# Patient Record
Sex: Male | Born: 1960 | Smoking: Current every day smoker
Health system: Southern US, Community
[De-identification: ages and names within clinical notes are randomized; demographics above are authoritative.]

---

## 2007-04-23 ENCOUNTER — Emergency Department: Payer: Self-pay | Admitting: Internal Medicine

## 2011-04-09 ENCOUNTER — Ambulatory Visit: Payer: Self-pay | Admitting: Internal Medicine

## 2017-12-13 ENCOUNTER — Emergency Department
Admission: EM | Admit: 2017-12-13 | Discharge: 2017-12-13 | Disposition: A | Discharge status: Home / Self Care | Payer: 59 | Attending: Emergency Medicine | Admitting: Emergency Medicine

## 2017-12-13 ENCOUNTER — Other Ambulatory Visit: Payer: Self-pay

## 2017-12-13 ENCOUNTER — Emergency Department: Payer: 59

## 2017-12-13 DIAGNOSIS — F17210 Nicotine dependence, cigarettes, uncomplicated: Secondary | ICD-10-CM | POA: Insufficient documentation

## 2017-12-13 DIAGNOSIS — R0789 Other chest pain: Secondary | ICD-10-CM | POA: Insufficient documentation

## 2017-12-13 DIAGNOSIS — R079 Chest pain, unspecified: Secondary | ICD-10-CM

## 2017-12-13 LAB — CBC
HEMATOCRIT: 45.8 % (ref 40.0–52.0)
Hemoglobin: 15.6 g/dL (ref 13.0–18.0)
MCH: 29.8 pg (ref 26.0–34.0)
MCHC: 34 g/dL (ref 32.0–36.0)
MCV: 87.7 fL (ref 80.0–100.0)
Platelets: 172 10*3/uL (ref 150–440)
RBC: 5.23 MIL/uL (ref 4.40–5.90)
RDW: 14.4 % (ref 11.5–14.5)
WBC: 5.9 10*3/uL (ref 3.8–10.6)

## 2017-12-13 LAB — TROPONIN I: Troponin I: <0.03 ng/mL (ref <0.03)

## 2017-12-13 LAB — BASIC METABOLIC PANEL
Anion gap: 8 (ref 5–15)
BUN: 13 mg/dL (ref 6–20)
CHLORIDE: 103 mmol/L (ref 101–111)
CO2: 25 mmol/L (ref 22–32)
Calcium: 8.9 mg/dL (ref 8.9–10.3)
Creatinine, Ser: 0.94 mg/dL (ref 0.61–1.24)
GFR calc non Af Amer: >60 mL/min (ref >60)
Glucose, Bld: 134 mg/dL — ABNORMAL HIGH (ref 65–99)
POTASSIUM: 3.9 mmol/L (ref 3.5–5.1)
SODIUM: 136 mmol/L (ref 135–145)

## 2017-12-13 NOTE — ED Provider Notes (Addendum)
[facility] Emergency Departent Provider Note  ___________________________________________   First MD Initiated Contact with Patient 12/13/17 1931     (approximate)  I have reviewed the triage vital signs and the nursing notes.   HISTORY  Chief Complaint Chest Pain  HPI Daleen SnookRichard A Tomkinson is a 57 y.o. male with a history of cervical radiculopathy as well as smoking who was presented to the emergency department today with left-sided chest as well as left sided arm pain.  He says that over the past 2 days he has had left upper chest pain is been rating down the left arm and up to the left jaw that lasts for only several seconds.  He feels like an electric shock type pain.  Says that he is also been short of breath over the past several days and went up a flight of stairs today and was very short of breath and broke out in a sweat and this would brought him to the emergency department.  He says that he has had right-sided arm pain before that is been attributed to cervical radiculopathy.  However, the left-sided chest as well as arm pain is new.  Denies any nausea.  Denies any symptoms at this time.  Says that his father died from heart attack in his early 3360s.  Patient says that he is a negative stress test in his past but this was years ago.  Denies any weakness.   History reviewed. No pertinent past medical history.  There are no active problems to display for this patient.   History reviewed. No pertinent surgical history.  Prior to Admission medications   Not on File    Allergies Wellbutrin [bupropion]  No family history on file.  Social History Social History   Tobacco Use  . Smoking status: Current Every Day Smoker  Substance Use Topics  . Alcohol use: Never    Frequency: Never  . Drug use: Not on file    Review of Systems  Constitutional: No fever/chills Eyes: No visual changes. ENT: No sore throat. Cardiovascular: As  above Respiratory: As above Gastrointestinal: No abdominal pain.  No nausea, no vomiting.  No diarrhea.  No constipation. Genitourinary: Negative for dysuria. Musculoskeletal: Negative for back pain. Skin: Negative for rash. Neurological: Negative for headaches, focal weakness or numbness.   ____________________________________________   PHYSICAL EXAM:  VITAL SIGNS: ED Triage Vitals  Enc Vitals Group     BP 12/13/17 1742 133/69     Pulse Rate 12/13/17 1742 83     Resp 12/13/17 1742 18     Temp 12/13/17 1742 98.4 F (36.9 C)     Temp Source 12/13/17 1742 Oral     SpO2 12/13/17 1742 96 %     Weight 12/13/17 1740 208 lb (94.3 kg)     Height 12/13/17 1740 6\' 1"  (1.854 m)     Head Circumference --      Peak Flow --      Pain Score 12/13/17 1740 3     Pain Loc --      Pain Edu? --      Excl. in GC? --     Constitutional: Alert and oriented. Well appearing and in no acute distress. Eyes: Conjunctivae are normal.  Head: Atraumatic. Nose: No congestion/rhinnorhea. Mouth/Throat: Mucous membranes are moist.  Neck: No stridor.   Cardiovascular: Normal rate, regular rhythm. Grossly normal heart sounds.  Good peripheral circulation with equal and bilateral radial pulses. Respiratory: Normal respiratory effort.  No  retractions. Lungs CTAB. Gastrointestinal: Soft and nontender. No distention.  Musculoskeletal: No lower extremity tenderness nor edema.  No joint effusions. Neurologic:  Normal speech and language. No gross focal neurologic deficits are appreciated. Skin:  Skin is warm, dry and intact. No rash noted. Psychiatric: Mood and affect are normal. Speech and behavior are normal.  ____________________________________________   LABS (all labs ordered are listed, but only abnormal results are displayed)  Labs Reviewed  BASIC METABOLIC PANEL - Abnormal; Notable for the following components:      Result Value   Glucose, Bld 134 (*)    All other components within normal  limits  CBC  TROPONIN I  TROPONIN I   ____________________________________________  EKG  ED ECG REPORT I, Arelia Longest, the attending physician, personally viewed and interpreted this ECG.   Date: 12/13/2017  EKG Time: 1742  Rate: 85  Rhythm: normal sinus rhythm  Axis: Normal  Intervals:none  ST&T Change: No ST segment elevation or depression.  No abnormal T wave inversion.  ____________________________________________  RADIOLOGY  Nodularity versus scarring to the right upper field ____________________________________________   PROCEDURES  Procedure(s) performed:   Procedures  Critical Care performed:   ____________________________________________   INITIAL IMPRESSION / ASSESSMENT AND PLAN / ED COURSE  Pertinent labs & imaging results that were available during my care of the patient were reviewed by me and considered in my medical decision making (see chart for details).  Differential diagnosis includes, but is not limited to, ACS, aortic dissection, pulmonary embolism, cardiac tamponade, pneumothorax, pneumonia, pericarditis, myocarditis, GI-related causes including esophagitis/gastritis, and musculoskeletal chest wall pain.   As part of my medical decision making, I reviewed the following data within the electronic MEDICAL RECORD NUMBER Notes from prior outpatient records  Patient asymptomatic at this time.  Heart score is a 09/03/2002.  Discussed further options with the patient including second troponin and overnights in hospital but he says that he would like to go home.  He says that he was concerned about a stroke.  However, I told him that these are very unlikely to be stroke symptoms and I am much more concerned for cardiac disease.  Pt refused 2nd troponin and does not want to stay for admission.  He will follow with cardiology in the office.  He knows to return immediately for any worsening or concerning symptoms.  He is aware of my suspicion for possible  cardiac disease in the fatal nature versus disabling nature of a potentially adverse outcome.  He is clinically sober at this time and has capacity to make medical decisions. ____________________________________________   FINAL CLINICAL IMPRESSION(S) / ED DIAGNOSES  Chest pain.    NEW MEDICATIONS STARTED DURING THIS VISIT:  New Prescriptions   No medications on file     Note:  This document was prepared using Dragon voice recognition software and may include unintentional dictation errors.     Myrna Blazer, MD 12/13/17 2132  Patient was made aware of the nodularity to his right upper field.  He says that it was likely scarring as was suspected also by the radiologist because he has a history of "bilateral pneumonia."  He will be following up with his primary care doctor regarding this.   Myrna Blazer, MD 12/13/17 2140

## 2017-12-13 NOTE — ED Notes (Signed)
Patient refused blood draw at this time.  MD at bedside with patient discussing risks.

## 2017-12-13 NOTE — ED Triage Notes (Signed)
Left sided CP radiating down left arm that began yesterday. Reports mouth twitching yesterday which stopped. Pt alert and oriented X4, active, cooperative, pt in NAD. RR even and unlabored, color WNL.

## 2017-12-15 ENCOUNTER — Telehealth: Payer: Self-pay

## 2017-12-15 NOTE — Telephone Encounter (Signed)
Lmov for patient to schedule ED fu  Seen on 12/13/17 for CP   Will try again at a later time

## 2017-12-19 NOTE — Telephone Encounter (Signed)
Patient declines to schedule.  Says he is going to [facility] for fu

## 2019-02-21 IMAGING — CR DG CHEST 2V
1 series · 2 of 2 positions shown · non-contrast
Comparison: Chest CT April 23, 2007 and chest x-ray April 23, 2007

CLINICAL DATA: Left-sided chest pain radiating down left arm.

EXAM:
CHEST - 2 VIEW

[Series 1: dg chest 2 view · 0.14mm/px · 2 of 2 slices shown]
[im 1/2]
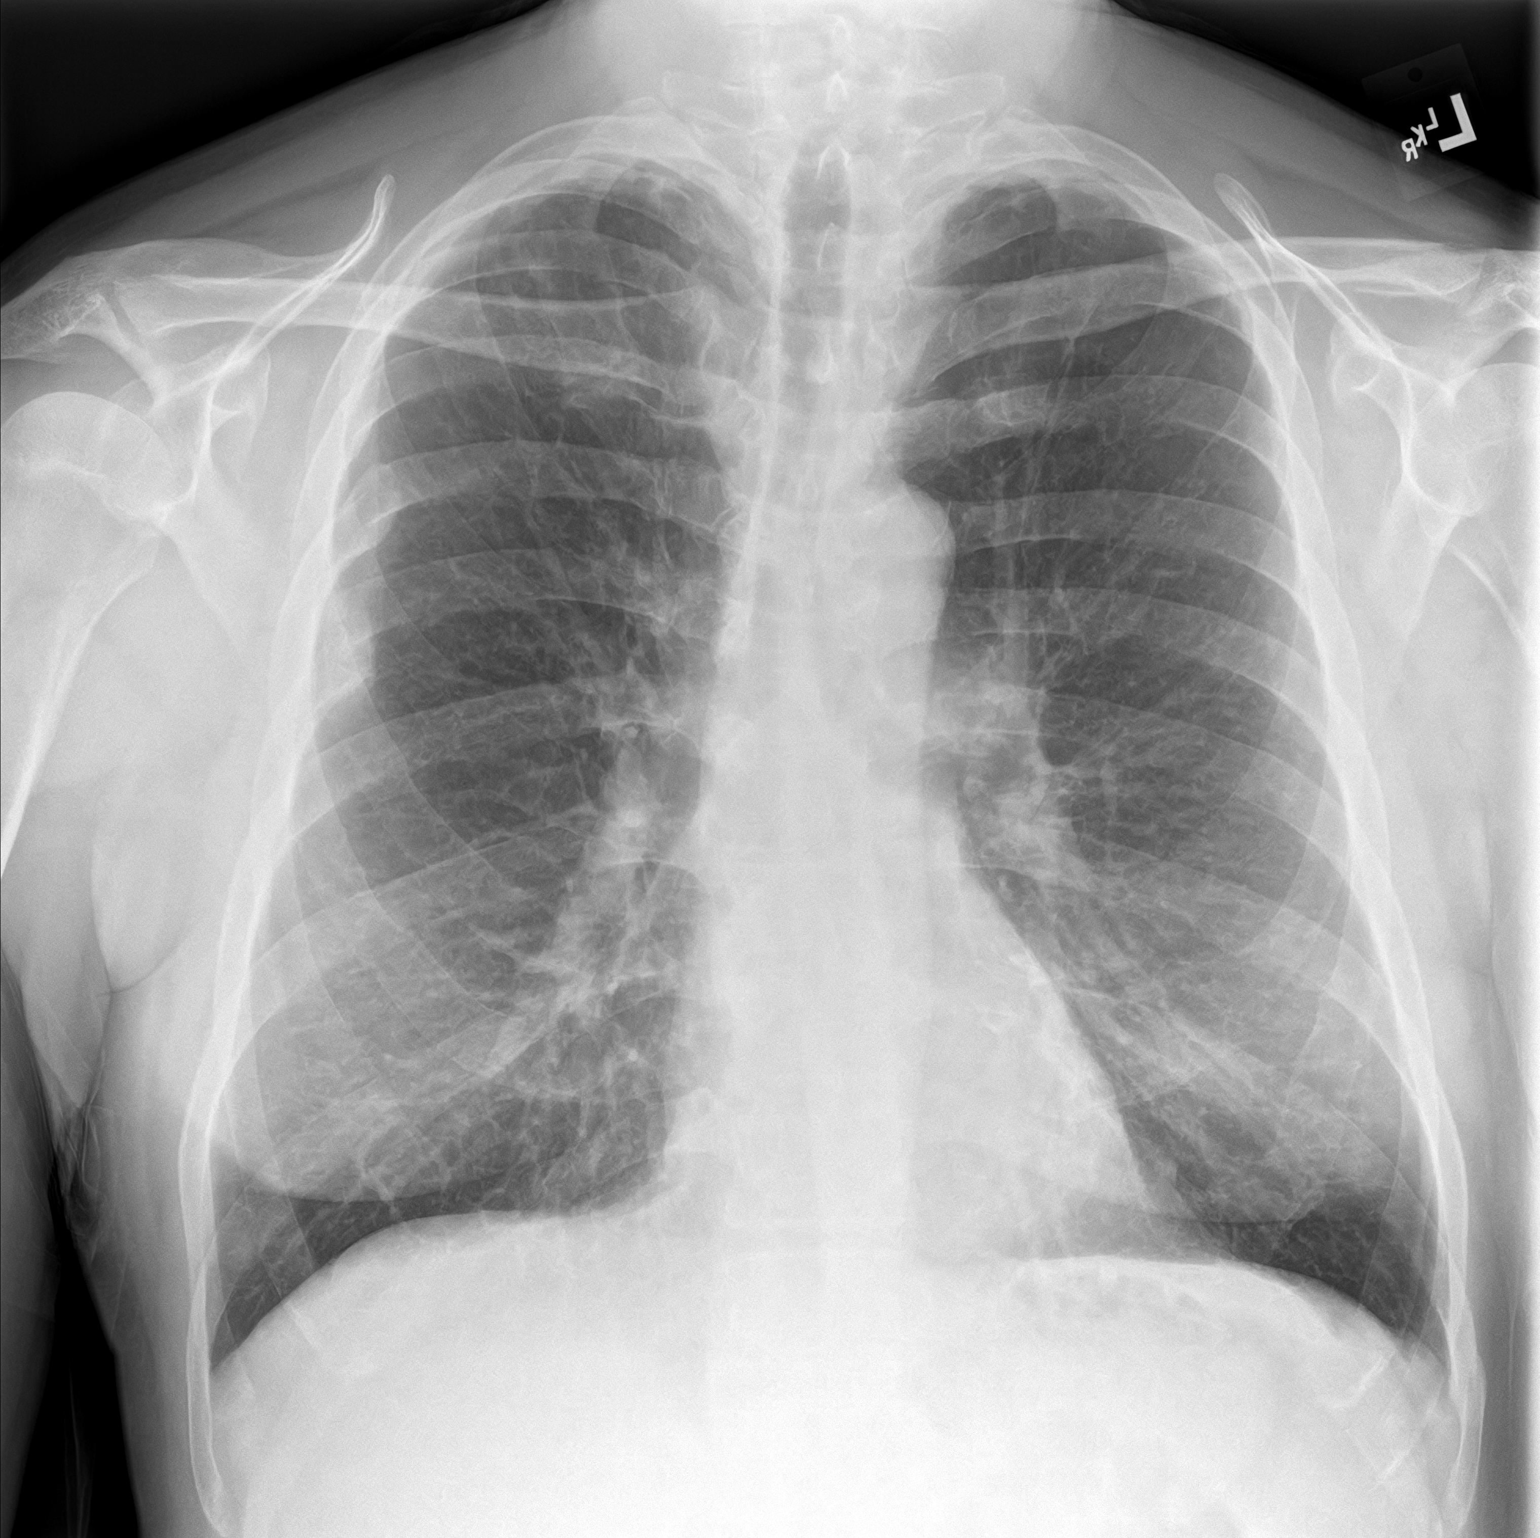
[im 2/2]
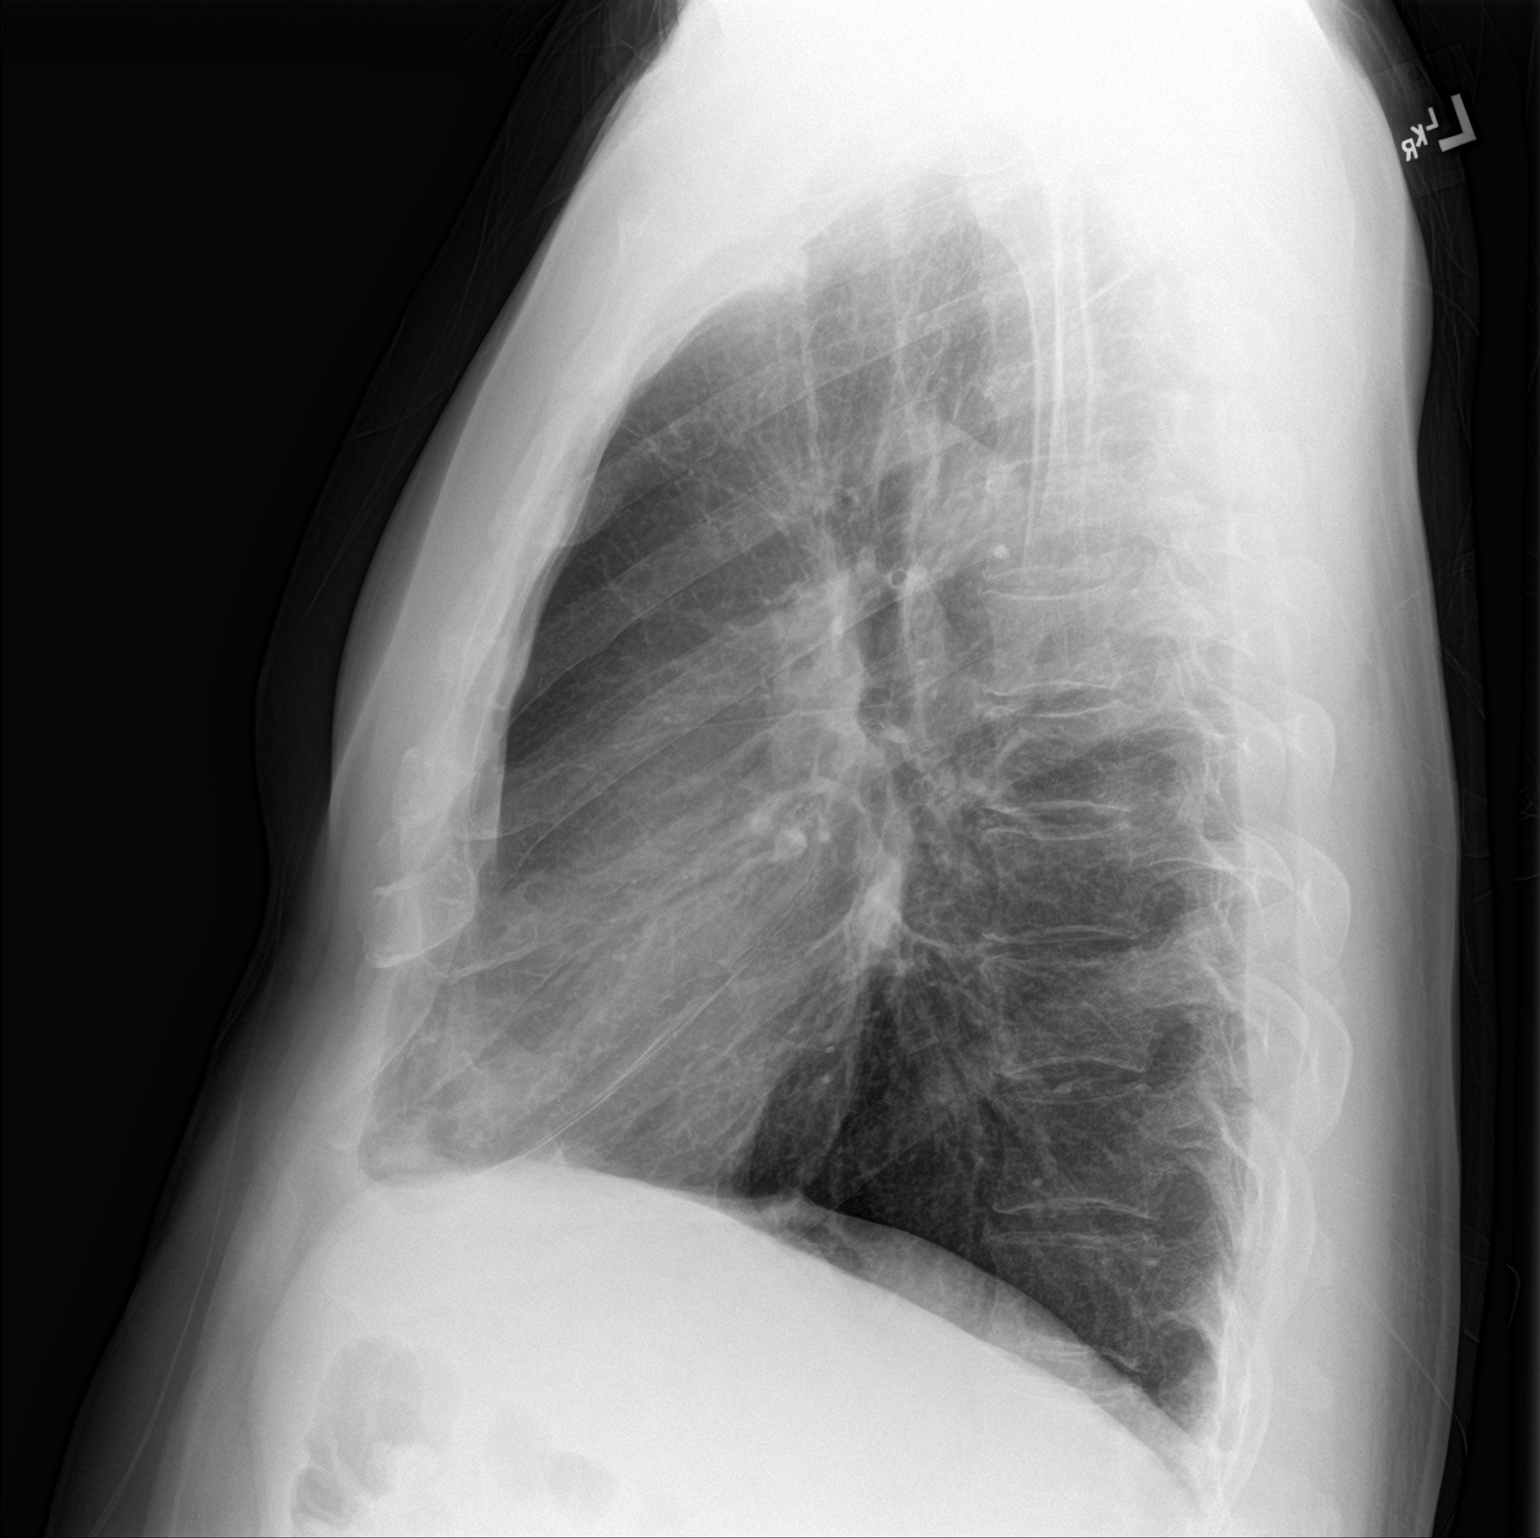

[2 of 2 positions shown; findings below may reference images not displayed]

FINDINGS: There are several small nodular densities in the right apex, not
seen previously. Scarring seen in the apices. No pneumothorax. The
heart, hila, and mediastinum are normal. Healed right rib fractures.
No other infiltrate.
IMPRESSION: There is a small region of nodular densities in the right apex which
could be infectious or due to scarring. Recommend short-term
follow-up to ensure resolution.

## 2023-08-26 ENCOUNTER — Ambulatory Visit
Admission: EM | Admit: 2023-08-26 | Discharge: 2023-08-26 | Disposition: A | Discharge status: Home / Self Care | Payer: BC Managed Care – PPO | Attending: Emergency Medicine | Admitting: Emergency Medicine

## 2023-08-26 DIAGNOSIS — M25562 Pain in left knee: Secondary | ICD-10-CM

## 2023-08-26 DIAGNOSIS — K047 Periapical abscess without sinus: Secondary | ICD-10-CM | POA: Diagnosis not present

## 2023-08-26 MED ORDER — CLINDAMYCIN HCL 300 MG PO CAPS: 300.0000 mg | ORAL_CAPSULE | Freq: Three times a day (TID) | ORAL | 0 refills | Status: AC

## 2023-08-26 NOTE — ED Triage Notes (Signed)
 Pt presents with mouth sore on gums and pain in jaw on left side x 2 days. Pt states he is also having swelling and pain to left knee x 2 weeks.

## 2023-08-26 NOTE — Discharge Instructions (Addendum)
 Take the clindamycin as directed.  Follow-up with a dentist as soon as possible.  See the attached information on dental abscess and the attached dental resource guide.    Take Tylenol or ibuprofen as needed for discomfort.    Follow-up with an orthopedist such as the one listed below if your knee pain is not improving.  See the attached information on knee pain.

## 2023-08-26 NOTE — ED Provider Notes (Signed)
 Shawn Watson    CSN: [id number] Arrival date & time: 08/26/23  1217      History   Chief Complaint Chief Complaint  Patient presents with   Mouth Lesions    HPI Shawn Watson is a 63 y.o. male.  Patient presents with painful swelling around a left lower tooth and gum x 2 days.  No fever or difficulty swallowing.  Patient also presents with pain and swelling of his left knee x 2 weeks.  No trauma.  No redness, bruising, wounds, numbness, weakness.  No treatments at home.  The history is provided by the patient and medical records.    History reviewed. No pertinent past medical history.  There are no active problems to display for this patient.   History reviewed. No pertinent surgical history.     Home Medications    Prior to Admission medications   Medication Sig Start Date End Date Taking? Authorizing Provider  clindamycin (CLEOCIN) 300 MG capsule Take 1 capsule (300 mg total) by mouth 3 (three) times daily. 08/26/23  Yes Mickie Bail, NP    Family History History reviewed. No pertinent family history.  Social History Social History   Tobacco Use   Smoking status: Every Day  Substance Use Topics   Alcohol use: Never   Drug use: Never     Allergies   Amoxicillin, Amoxicillin-pot clavulanate, and Wellbutrin [bupropion]   Review of Systems Review of Systems  Constitutional:  Negative for chills and fever.  HENT:  Positive for dental problem and facial swelling. Negative for sore throat, trouble swallowing and voice change.   Respiratory:  Negative for cough and shortness of breath.   Musculoskeletal:  Positive for arthralgias and joint swelling. Negative for gait problem.     Physical Exam Triage Vital Signs ED Triage Vitals  Encounter Vitals Group     BP 08/26/23 1330 129/72     Systolic BP Percentile --      Diastolic BP Percentile --      Pulse Rate 08/26/23 1330 88     Resp 08/26/23 1330 18     Temp 08/26/23 1330 98 F  (36.7 C)     Temp Source 08/26/23 1330 Oral     SpO2 08/26/23 1330 94 %     Weight --      Height --      Head Circumference --      Peak Flow --      Pain Score 08/26/23 1331 7     Pain Loc --      Pain Education --      Exclude from Growth Chart --    No data found.  Updated Vital Signs BP 129/72 (BP Location: Left Arm)   Pulse 88   Temp 98 F (36.7 C) (Oral)   Resp 18   SpO2 94%   Visual Acuity Right Eye Distance:   Left Eye Distance:   Bilateral Distance:    Right Eye Near:   Left Eye Near:    Bilateral Near:     Physical Exam Constitutional:      General: He is not in acute distress. HENT:     Mouth/Throat:     Mouth: Mucous membranes are moist.     Dentition: Dental caries present.   Cardiovascular:     Rate and Rhythm: Normal rate and regular rhythm.  Pulmonary:     Effort: Pulmonary effort is normal. No respiratory distress.  Musculoskeletal:  General: No swelling, tenderness, deformity or signs of injury. Normal range of motion.     Comments: Left knee: No erythema, edema, wounds.  FROM, sensation intact, strength 5/5.  Skin:    General: Skin is warm and dry.     Capillary Refill: Capillary refill takes less than 2 seconds.     Findings: No bruising, erythema, lesion or rash.  Neurological:     General: No focal deficit present.     Mental Status: He is alert and oriented to person, place, and time.     Sensory: No sensory deficit.     Motor: No weakness.     Gait: Gait normal.      UC Treatments / Results  Labs (all labs ordered are listed, but only abnormal results are displayed) Labs Reviewed - No data to display  EKG   Radiology No results found.  Procedures Procedures (including critical care time)  Medications Ordered in UC Medications - No data to display  Initial Impression / Assessment and Plan / UC Course  I have reviewed the triage vital signs and the nursing notes.  Pertinent labs & imaging results that were  available during my care of the patient were reviewed by me and considered in my medical decision making (see chart for details).    Dental abscess, left knee pain.  Patient is allergic to penicillin.  Treating dental abscess with clindamycin.  Education provided on dental abscess.  Instructed patient to schedule an appointment with a dentist to soon as possible.  Dental resource guide provided.  Discussed Tylenol or ibuprofen as needed for discomfort.  Education provided on knee pain.  Instructed patient to follow-up with an orthopedist if his symptoms are not improving.  He agrees to plan of care.  Final Clinical Impressions(s) / UC Diagnoses   Final diagnoses:  Dental abscess  Acute pain of left knee     Discharge Instructions      Take the clindamycin as directed.  Follow-up with a dentist as soon as possible.  See the attached information on dental abscess and the attached dental resource guide.    Take Tylenol or ibuprofen as needed for discomfort.    Follow-up with an orthopedist such as the one listed below if your knee pain is not improving.  See the attached information on knee pain.       ED Prescriptions     Medication Sig Dispense Auth. Provider   clindamycin (CLEOCIN) 300 MG capsule Take 1 capsule (300 mg total) by mouth 3 (three) times daily. 21 capsule Mickie Bail, NP      PDMP not reviewed this encounter.   Mickie Bail, NP 08/26/23 [phone]

## 2024-03-07 ENCOUNTER — Ambulatory Visit
Admission: EM | Admit: 2024-03-07 | Discharge: 2024-03-07 | Disposition: A | Discharge status: Home / Self Care | Attending: Emergency Medicine | Admitting: Emergency Medicine

## 2024-03-07 ENCOUNTER — Encounter: Payer: Self-pay | Admitting: Emergency Medicine

## 2024-03-07 DIAGNOSIS — R102 Pelvic and perineal pain: Secondary | ICD-10-CM

## 2024-03-07 LAB — POC COVID19/FLU A&B COMBO
Covid Antigen, POC: NEGATIVE
Influenza A Antigen, POC: NEGATIVE
Influenza B Antigen, POC: NEGATIVE

## 2024-03-07 MED ORDER — AZITHROMYCIN 250 MG PO TABS: 250.0000 mg | ORAL_TABLET | Freq: Every day | ORAL | 0 refills | Status: AC

## 2024-03-07 NOTE — Discharge Instructions (Signed)
 Today you are evaluated for your fever and abdominal pain, no fever today in clinic but there is tenderness to pubic/pelvic region of your abdomen, area like belief that is your bladder however you do not have any urinary symptoms such as frequency, discomfort or blood therefore I do not believe that you have a bladder infection or kidney stone at this time  It is possible that you are experiencing a stomach bug as on exam and you have a very active intestinal and stomach region however typically this also presents with nausea vomiting and diarrhea  As at this time, an unknown cause for your symptoms will empirically place you on antibiotics to ideally prevent any worsening  Take azithromycin  as directed  Would recommend attempting use of over-the-counter medicine such as Pepto-Bismol, simethicone, Tums etc. to help settle your active abdomen  COVID and flu testing are negative  If your symptoms continue to persist you may return to urgent care for reevaluation  At any point if you have worsening abdominal pain please go to the nearest emergency department for immediate evaluation

## 2024-03-07 NOTE — ED Provider Notes (Signed)
 Shawn Watson    CSN: [id number] Arrival date & time: 03/07/24  0802      History   Chief Complaint Chief Complaint  Patient presents with   Fever   Abdominal Pain    HPI Shawn Watson is a 63 y.o. male.   Patient presents for evaluation of subjective fever described as night sweats, lower abdominal pain occurring intermittently and a foul taste in the mouth present for 2 days.  Symptoms have improved today, rating a 2 out of 10.  Has taken Tylenol with minimal relief.  Denies nausea vomiting diarrhea, urinary symptoms, URI symptoms.  Denies dietary changes, recent travel.  No known sick contacts.  Decreased appetite but tolerable to some food and liquids.  History reviewed. No pertinent past medical history.  There are no active problems to display for this patient.   History reviewed. No pertinent surgical history.     Home Medications    Prior to Admission medications   Medication Sig Start Date End Date Taking? Authorizing Provider  azithromycin  (ZITHROMAX ) 250 MG tablet Take 1 tablet (250 mg total) by mouth daily. Take first 2 tablets together, then 1 every day until finished. 03/07/24  Yes Evin Chirco R, NP  clindamycin  (CLEOCIN ) 300 MG capsule Take 1 capsule (300 mg total) by mouth 3 (three) times daily. 08/26/23   Corlis Burnard DEL, NP    Family History History reviewed. No pertinent family history.  Social History Social History   Tobacco Use   Smoking status: Every Day  Substance Use Topics   Alcohol use: Never   Drug use: Never     Allergies   Amoxicillin, Amoxicillin-pot clavulanate, and Wellbutrin [bupropion]   Review of Systems Review of Systems   Physical Exam Triage Vital Signs ED Triage Vitals  Encounter Vitals Group     BP 03/07/24 0808 122/83     Girls Systolic BP Percentile --      Girls Diastolic BP Percentile --      Boys Systolic BP Percentile --      Boys Diastolic BP Percentile --      Pulse Rate 03/07/24 0808  93     Resp 03/07/24 0808 20     Temp 03/07/24 0808 98.3 F (36.8 C)     Temp Source 03/07/24 0808 Oral     SpO2 03/07/24 0808 97 %     Weight --      Height --      Head Circumference --      Peak Flow --      Pain Score 03/07/24 0812 2     Pain Loc --      Pain Education --      Exclude from Growth Chart --    No data found.  Updated Vital Signs BP 122/83 (BP Location: Left Arm)   Pulse 93   Temp 98.3 F (36.8 C) (Oral)   Resp 20   SpO2 97%   Visual Acuity Right Eye Distance:   Left Eye Distance:   Bilateral Distance:    Right Eye Near:   Left Eye Near:    Bilateral Near:     Physical Exam Constitutional:      Appearance: Normal appearance.  Eyes:     Extraocular Movements: Extraocular movements intact.  Pulmonary:     Effort: Pulmonary effort is normal.  Abdominal:     General: Abdomen is flat. Bowel sounds are increased.     Palpations: Abdomen is soft.  Tenderness: There is abdominal tenderness in the suprapubic area.  Neurological:     Mental Status: He is alert and oriented to person, place, and time. Mental status is at baseline.      UC Treatments / Results  Labs (all labs ordered are listed, but only abnormal results are displayed) Labs Reviewed  POC COVID19/FLU A&B COMBO - Normal    EKG   Radiology No results found.  Procedures Procedures (including critical care time)  Medications Ordered in UC Medications - No data to display  Initial Impression / Assessment and Plan / UC Course  I have reviewed the triage vital signs and the nursing notes.  Pertinent labs & imaging results that were available during my care of the patient were reviewed by me and considered in my medical decision making (see chart for details).  Acute suprapubic pain  Vital signs are stable, patient in no signs of distress nontoxic-appearing, tenderness noted to the suprapubic region but nonguarding, able to sit comfortably within exam room, low suspicion  for any acutely inflamed organ, stable for outpatient management, no fever notated on exam, COVID and flu testing negative, discussed findings with patient, unknown etiology is common, most likely viral, discussed possible stomach bug, empirically placed on azithromycin  and recommended over-the-counter medications, advised to monitor closely and for any worsening abdominal pain patient to go to the nearest emergency department for immediate evaluation, work note given Final Clinical Impressions(s) / UC Diagnoses   Final diagnoses:  Suprapubic pain, acute     Discharge Instructions      Today you are evaluated for your fever and abdominal pain, no fever today in clinic but there is tenderness to pubic/pelvic region of your abdomen, area like belief that is your bladder however you do not have any urinary symptoms such as frequency, discomfort or blood therefore I do not believe that you have a bladder infection or kidney stone at this time  It is possible that you are experiencing a stomach bug as on exam and you have a very active intestinal and stomach region however typically this also presents with nausea vomiting and diarrhea  As at this time, an unknown cause for your symptoms will empirically place you on antibiotics to ideally prevent any worsening  Take azithromycin  as directed  Would recommend attempting use of over-the-counter medicine such as Pepto-Bismol, simethicone, Tums etc. to help settle your active abdomen  COVID and flu testing are negative  If your symptoms continue to persist you may return to urgent care for reevaluation  At any point if you have worsening abdominal pain please go to the nearest emergency department for immediate evaluation   ED Prescriptions     Medication Sig Dispense Auth. Provider   azithromycin  (ZITHROMAX ) 250 MG tablet Take 1 tablet (250 mg total) by mouth daily. Take first 2 tablets together, then 1 every day until finished. 6 tablet Karyl Sharrar,  Kaeleen Odom R, NP      PDMP not reviewed this encounter.   Teresa Shelba SAUN[place], [place] 03/07/24 [phone]

## 2024-03-07 NOTE — ED Triage Notes (Signed)
 Patient reports fever, lower abdominal pain, and fatigue. Rates pain 2/10. Patient took Tylenol last night for pain with mild relief.

## 2024-03-08 ENCOUNTER — Telehealth: Payer: Self-pay

## 2024-03-08 NOTE — Telephone Encounter (Signed)
 Patient called in to discuss possible allergic reaction to prescribed antibiotic (Azithromycin ) from visit 03/07/24. Reports he developed an itchy rash today present to bilateral arms. Denies any other symptoms including SHOB when asked.   Patient discussed with Burnard Cork NP. Advised to stop azithromycin  immediately and take benadryl otc per box instructions.   Attempted to make a PCP appointment for patient follow-up. Patient declined assistance at this time.

## 2024-03-20 ENCOUNTER — Encounter: Payer: Self-pay | Admitting: Emergency Medicine

## 2024-03-20 ENCOUNTER — Ambulatory Visit: Admission: EM | Admit: 2024-03-20 | Discharge: 2024-03-20 | Disposition: A | Discharge status: Home / Self Care

## 2024-03-20 DIAGNOSIS — S161XXA Strain of muscle, fascia and tendon at neck level, initial encounter: Secondary | ICD-10-CM

## 2024-03-20 MED ORDER — BACLOFEN 10 MG PO TABS: 10.0000 mg | ORAL_TABLET | Freq: Three times a day (TID) | ORAL | 0 refills | Status: AC

## 2024-03-20 MED ORDER — DICLOFENAC SODIUM 50 MG PO TBEC: 50.0000 mg | DELAYED_RELEASE_TABLET | Freq: Two times a day (BID) | ORAL | 1 refills | Status: AC
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
